# Patient Record
Sex: Female | Born: 1965 | Race: White | Hispanic: No | Marital: Married | Smoking: Never smoker
Health system: Southern US, Community
[De-identification: ages and names within clinical notes are randomized; demographics above are authoritative.]

## PROBLEM LIST (undated history)

## (undated) DIAGNOSIS — E079 Disorder of thyroid, unspecified: Secondary | ICD-10-CM

## (undated) DIAGNOSIS — I10 Essential (primary) hypertension: Secondary | ICD-10-CM

## (undated) DIAGNOSIS — C801 Malignant (primary) neoplasm, unspecified: Secondary | ICD-10-CM

## (undated) DIAGNOSIS — E119 Type 2 diabetes mellitus without complications: Secondary | ICD-10-CM

## (undated) HISTORY — PX: OTHER SURGICAL HISTORY: SHX169

## (undated) HISTORY — PX: CHOLECYSTECTOMY: SHX55

---

## 2010-02-02 ENCOUNTER — Ambulatory Visit: Payer: Self-pay | Admitting: Internal Medicine

## 2015-02-16 ENCOUNTER — Ambulatory Visit
Admission: EM | Admit: 2015-02-16 | Discharge: 2015-02-16 | Disposition: A | Discharge status: Home / Self Care | Payer: BC Managed Care – PPO | Attending: Family Medicine | Admitting: Family Medicine

## 2015-02-16 ENCOUNTER — Ambulatory Visit (INDEPENDENT_AMBULATORY_CARE_PROVIDER_SITE_OTHER): Payer: BC Managed Care – PPO

## 2015-02-16 DIAGNOSIS — N200 Calculus of kidney: Secondary | ICD-10-CM | POA: Diagnosis not present

## 2015-02-16 DIAGNOSIS — R319 Hematuria, unspecified: Secondary | ICD-10-CM

## 2015-02-16 DIAGNOSIS — R1110 Vomiting, unspecified: Secondary | ICD-10-CM

## 2015-02-16 DIAGNOSIS — R1012 Left upper quadrant pain: Secondary | ICD-10-CM | POA: Diagnosis not present

## 2015-02-16 DIAGNOSIS — R112 Nausea with vomiting, unspecified: Secondary | ICD-10-CM | POA: Diagnosis not present

## 2015-02-16 DIAGNOSIS — R109 Unspecified abdominal pain: Secondary | ICD-10-CM

## 2015-02-16 HISTORY — DX: Malignant (primary) neoplasm, unspecified: C80.1

## 2015-02-16 HISTORY — DX: Disorder of thyroid, unspecified: E07.9

## 2015-02-16 HISTORY — DX: Type 2 diabetes mellitus without complications: E11.9

## 2015-02-16 HISTORY — DX: Essential (primary) hypertension: I10

## 2015-02-16 LAB — URINALYSIS COMPLETE WITH MICROSCOPIC (ARMC ONLY)
BACTERIA UA: NONE SEEN
Bilirubin Urine: NEGATIVE
Glucose, UA: NEGATIVE mg/dL
Ketones, ur: NEGATIVE mg/dL
Leukocytes, UA: NEGATIVE
NITRITE: NEGATIVE
PROTEIN: NEGATIVE mg/dL
SPECIFIC GRAVITY, URINE: 1.02 (ref 1.005–1.030)
pH: 6 (ref 5.0–8.0)

## 2015-02-16 LAB — BASIC METABOLIC PANEL
Anion gap: 10 (ref 5–15)
BUN: 25 mg/dL — AB (ref 6–20)
CO2: 24 mmol/L (ref 22–32)
Calcium: 9.5 mg/dL (ref 8.9–10.3)
Chloride: 103 mmol/L (ref 101–111)
Creatinine, Ser: 1.27 mg/dL — ABNORMAL HIGH (ref 0.44–1.00)
GFR, EST AFRICAN AMERICAN: 56 mL/min — AB (ref >60)
GFR, EST NON AFRICAN AMERICAN: 48 mL/min — AB (ref >60)
Glucose, Bld: 137 mg/dL — ABNORMAL HIGH (ref 65–99)
POTASSIUM: 4.5 mmol/L (ref 3.5–5.1)
SODIUM: 137 mmol/L (ref 135–145)

## 2015-02-16 LAB — CBC WITH DIFFERENTIAL/PLATELET
BASOS ABS: 0.1 10*3/uL (ref 0–0.1)
Basophils Relative: 1 %
EOS ABS: 0.1 10*3/uL (ref 0–0.7)
EOS PCT: 1 %
HCT: 38.5 % (ref 35.0–47.0)
Hemoglobin: 12.6 g/dL (ref 12.0–16.0)
LYMPHS PCT: 19 %
Lymphs Abs: 2.1 10*3/uL (ref 1.0–3.6)
MCH: 29 pg (ref 26.0–34.0)
MCHC: 32.8 g/dL (ref 32.0–36.0)
MCV: 88.3 fL (ref 80.0–100.0)
Monocytes Absolute: 1.1 10*3/uL — ABNORMAL HIGH (ref 0.2–0.9)
Monocytes Relative: 9 %
Neutro Abs: 7.9 10*3/uL — ABNORMAL HIGH (ref 1.4–6.5)
Neutrophils Relative %: 70 %
PLATELETS: 224 10*3/uL (ref 150–440)
RBC: 4.36 MIL/uL (ref 3.80–5.20)
RDW: 12.8 % (ref 11.5–14.5)
WBC: 11.3 10*3/uL — AB (ref 3.6–11.0)

## 2015-02-16 MED ORDER — HYDROCODONE-ACETAMINOPHEN 5-325 MG PO TABS: 1.0000 | ORAL_TABLET | Freq: Four times a day (QID) | ORAL | Status: AC | PRN

## 2015-02-16 MED ORDER — ONDANSETRON 8 MG PO TBDP: 8.0000 mg | ORAL_TABLET | Freq: Two times a day (BID) | ORAL | Status: AC

## 2015-02-16 MED ORDER — ONDANSETRON 8 MG PO TBDP
8.0000 mg | ORAL_TABLET | Freq: Once | ORAL | Status: AC
  Administered 2015-02-16: 8 mg via ORAL

## 2015-02-16 MED ORDER — KETOROLAC TROMETHAMINE 60 MG/2ML IM SOLN: 60.0000 mg | Freq: Once | INTRAMUSCULAR | Status: DC

## 2015-02-16 MED ORDER — TAMSULOSIN HCL 0.4 MG PO CAPS: 0.4000 mg | ORAL_CAPSULE | Freq: Every day | ORAL | Status: AC

## 2015-02-16 NOTE — ED Notes (Signed)
Woke suddenly at 2am with left flank pain radiating through to LUQ abdomen. Vomited when attempted to drink anything. Still nauseated. Hx kidney stone  05/2014.

## 2015-04-11 NOTE — ED Provider Notes (Signed)
CSN: [id number]     Arrival date & time 02/16/15  1056 History   First MD Initiated Contact with Patient 02/16/15 1145     Chief Complaint  Patient presents with  . Nephrolithiasis   (Consider location/radiation/quality/duration/timing/severity/associated sxs/prior Treatment) HPI Comments: 50 yo female with a c/o left flank pain since 2am this morning, associated with vomiting x2 today. Currently nauseated. Denies any fevers, chills. Has a h/o kidney stone.   The history is provided by the patient.    Past Medical History  Diagnosis Date  . Diabetes mellitus without complication ([place])   . Hypertension   . Thyroid disease   . Cancer [facility])    Past Surgical History  Procedure Laterality Date  . Pancreas removal    . Cholecystectomy     Family History  Problem Relation Age of Onset  . Cancer Father    Social History  Substance Use Topics  . Smoking status: Never Smoker   . Smokeless tobacco: None  . Alcohol Use: No   OB History    No data available     Review of Systems  Allergies  Penicillins and Erythromycin  Home Medications   Prior to Admission medications   Medication Sig Start Date End Date Taking? Authorizing Provider  cetirizine (ZYRTEC) 10 MG tablet Take 10 mg by mouth daily.   Yes Historical Provider, MD  levothyroxine (SYNTHROID, LEVOTHROID) 50 MCG tablet Take 50 mcg by mouth daily before breakfast.   Yes Historical Provider, MD  metFORMIN (GLUCOPHAGE) 500 MG tablet Take by mouth 2 (two) times daily with a meal.   Yes Historical Provider, MD  HYDROcodone-acetaminophen (NORCO/VICODIN) 5-325 MG tablet Take 1-2 tablets by mouth every 6 (six) hours as needed. 02/16/15   Norval Gable, MD  ondansetron (ZOFRAN ODT) 8 MG disintegrating tablet Take 1 tablet (8 mg total) by mouth 2 (two) times daily. 02/16/15   Norval Gable, MD  tamsulosin (FLOMAX) 0.4 MG CAPS capsule Take 1 capsule (0.4 mg total) by mouth daily. 02/16/15   Norval Gable, MD   Meds Ordered and  Administered this Visit   Medications  ondansetron (ZOFRAN-ODT) disintegrating tablet 8 mg (8 mg Oral Given 02/16/15 1118)    BP 144/72 mmHg  Pulse 68  Temp(Src) 97.3 F (36.3 C) (Tympanic)  Resp 16  Ht 5\' 3"  (1.6 m)  Wt 195 lb (88.451 kg)  BMI 34.55 kg/m2  SpO2 99% No data found.   Physical Exam  Constitutional: She appears well-developed and well-nourished. No distress.  Abdominal: Soft. Bowel sounds are normal. She exhibits no distension and no mass. There is tenderness (left flank). There is no rebound and no guarding.  Skin: She is not diaphoretic.  Nursing note and vitals reviewed.   ED Course  Procedures (including critical care time)  Labs Review Labs Reviewed  URINALYSIS COMPLETEWITH MICROSCOPIC ([facility] ONLY) - Abnormal; Notable for the following:    Color, Urine STRAW (*)    Hgb urine dipstick TRACE (*)    Squamous Epithelial / LPF 0-5 (*)    All other components within normal limits  BASIC METABOLIC PANEL - Abnormal; Notable for the following:    Glucose, Bld 137 (*)    BUN 25 (*)    Creatinine, Ser 1.27 (*)    GFR calc non Af Amer 48 (*)    GFR calc Af Amer 56 (*)    All other components within normal limits  CBC WITH DIFFERENTIAL/PLATELET - Abnormal; Notable for the following:    WBC 11.3 (*)  Neutro Abs 7.9 (*)    Monocytes Absolute 1.1 (*)    All other components within normal limits    Imaging Review No results found.   Visual Acuity Review  Right Eye Distance:   Left Eye Distance:   Bilateral Distance:    Right Eye Near:   Left Eye Near:    Bilateral Near:         MDM   1. Nephrolithiasis   2. Vomiting   3. Acute left flank pain   4. Hematuria   5. Nausea and vomiting, vomiting of unspecified type    Discharge Medication List as of 02/16/2015  2:28 PM    START taking these medications   Details  HYDROcodone-acetaminophen (NORCO/VICODIN) 5-325 MG tablet Take 1-2 tablets by mouth every 6 (six) hours as needed., Starting  02/16/2015, Until Discontinued, Print    ondansetron (ZOFRAN ODT) 8 MG disintegrating tablet Take 1 tablet (8 mg total) by mouth 2 (two) times daily., Starting 02/16/2015, Until Discontinued, Normal    tamsulosin (FLOMAX) 0.4 MG CAPS capsule Take 1 capsule (0.4 mg total) by mouth daily., Starting 02/16/2015, Until Discontinued, Normal       1. Labs/x-ray results and diagnosis reviewed with patient 2. rx as per orders above; reviewed possible side effects, interactions, risks and benefits  3. Recommend supportive treatment with increased fluids 4. Patient given zofran 8mg  odt with improvement of symptoms; tolerating po fluids prior to d/c 4. Follow-up prn if symptoms worsen or don't improve; to ED if symptoms worsen    Norval Gable, MD 04/11/15 1819

## 2017-01-15 IMAGING — CT CT RENAL STONE PROTOCOL
2 of 4 series · 16 of 46 positions shown, 18 images · non-contrast
Comparison: None.

CLINICAL DATA: Lower abdominal pain, primarily left-sided

EXAM:
CT ABDOMEN AND PELVIS WITHOUT CONTRAST
TECHNIQUE: Multidetector CT imaging of the abdomen and pelvis was performed
following the standard protocol without oral or intravenous contrast
material administration.

[Series 2: soft tissue · axial · 0.71mm/px · z∈[-904,-459]mm · 13 of 99 slices shown, 15 images]
[im 5/99  soft-tissue]
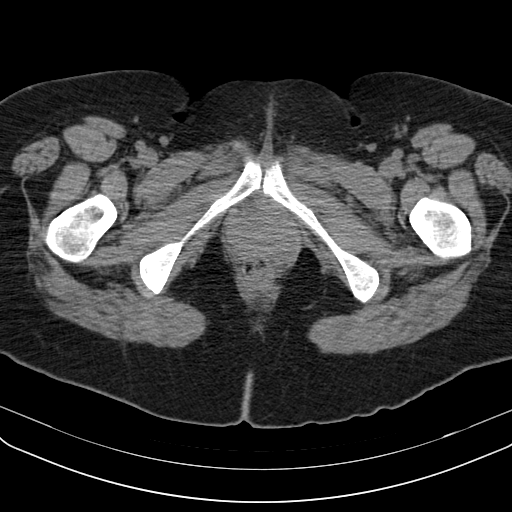
[im 5/99  bone]
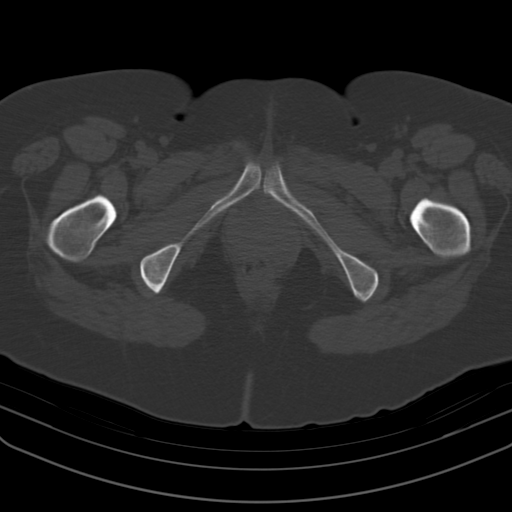
[im 13/99  soft-tissue]
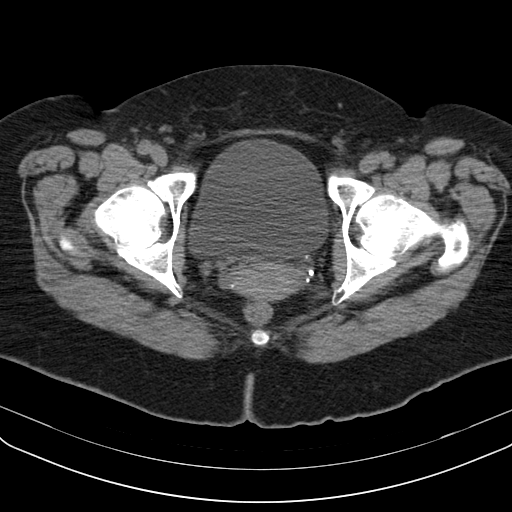
[im 22/99  soft-tissue]
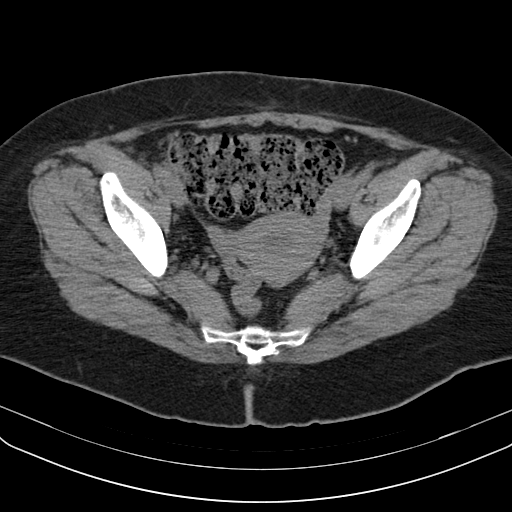
[im 26/99  soft-tissue]
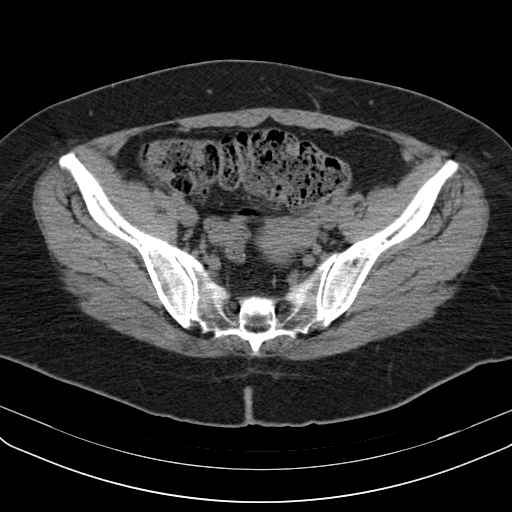
[im 35/99  soft-tissue]
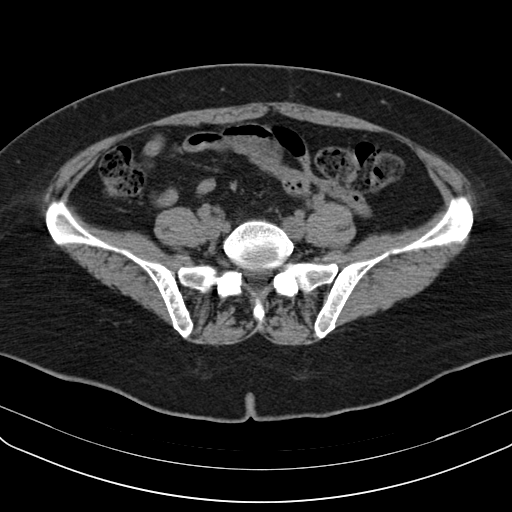
[im 43/99  soft-tissue]
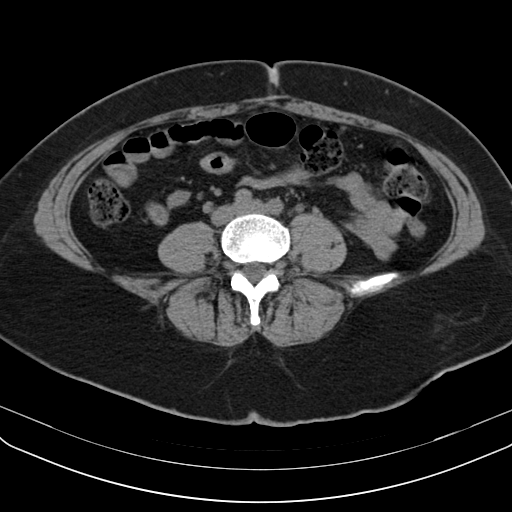
[im 52/99  soft-tissue]
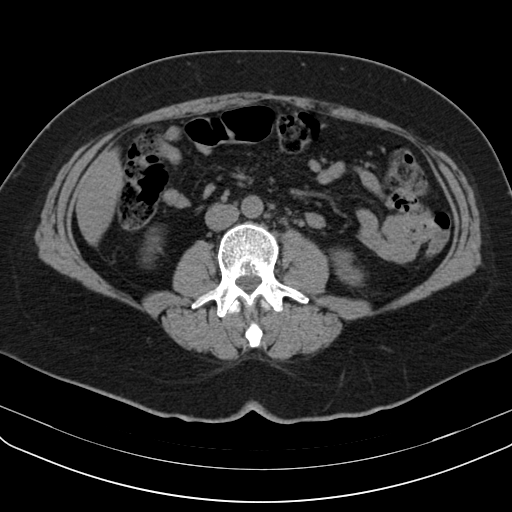
[im 56/99  soft-tissue]
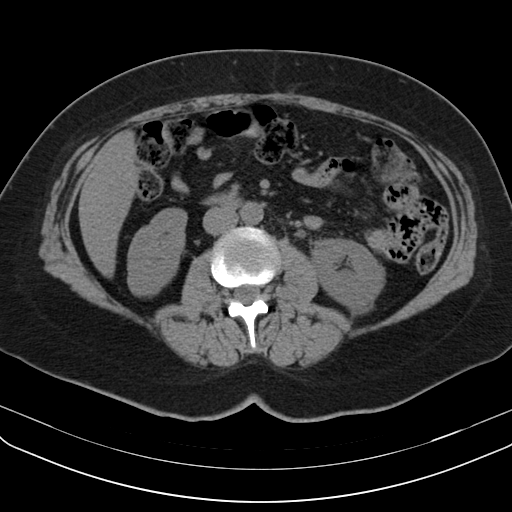
[im 64/99  soft-tissue]
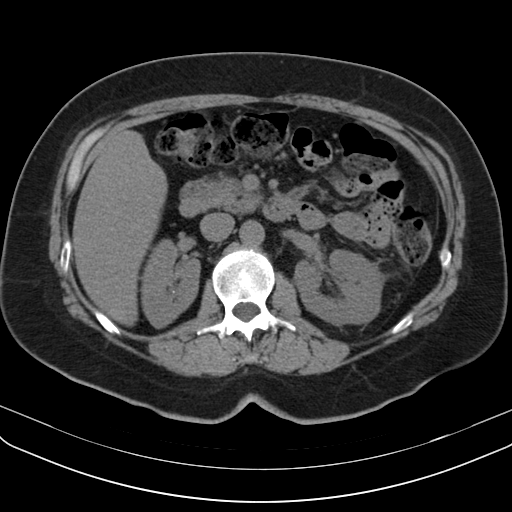
[im 64/99  bone]
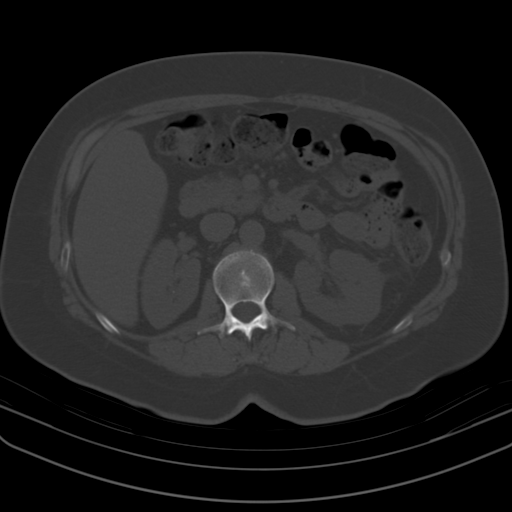
[im 73/99  soft-tissue]
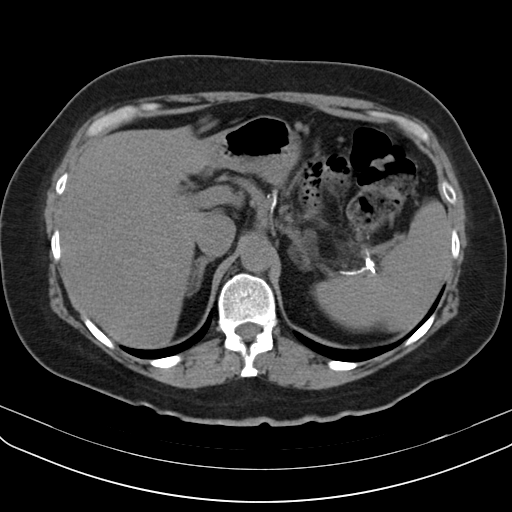
[im 77/99  soft-tissue]
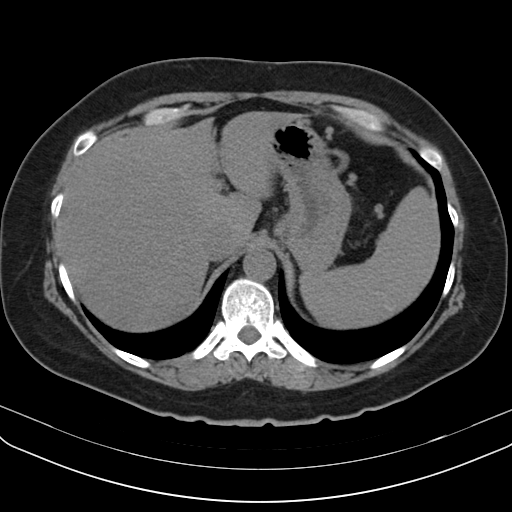
[im 86/99  soft-tissue]
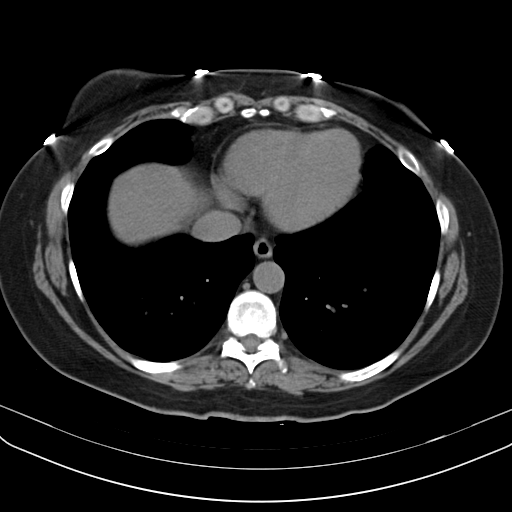
[im 94/99  soft-tissue]
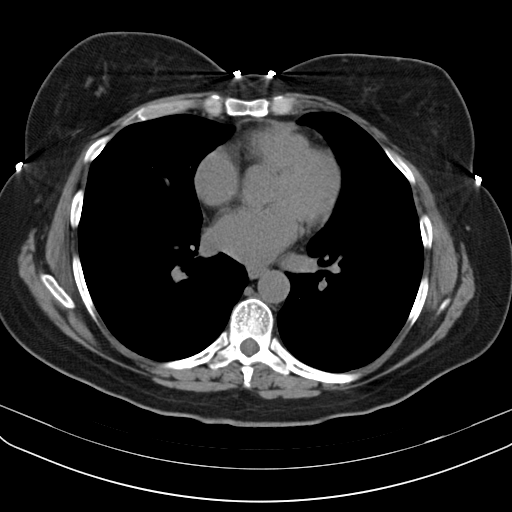

[Series 602: coronal · coronal · 0.97mm/px · 3 of 105 slices shown]
[im 35/105  soft-tissue]
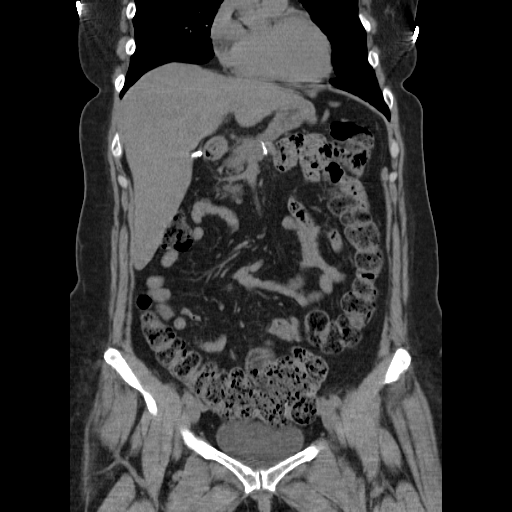
[im 47/105  soft-tissue]
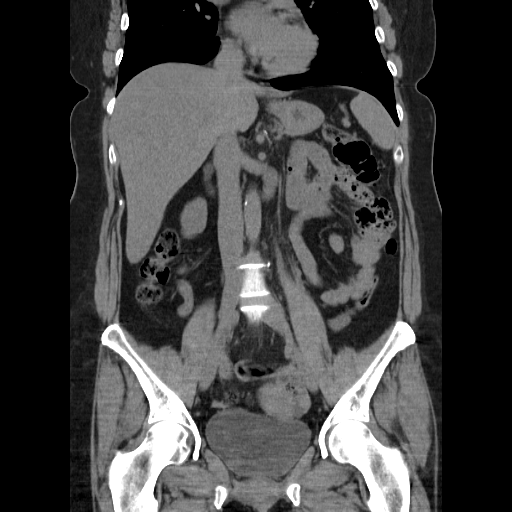
[im 58/105  soft-tissue]
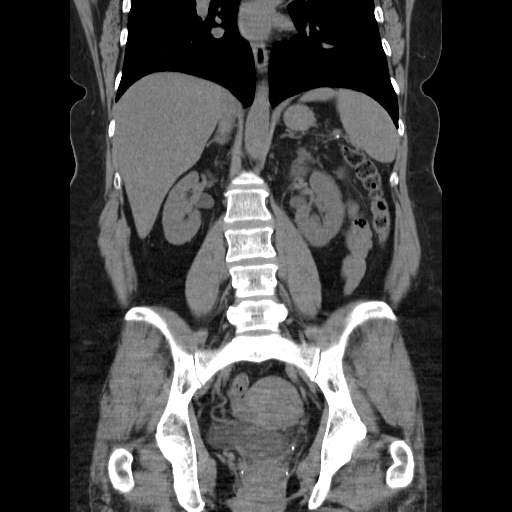

[16 of 46 positions shown; findings below may reference images not displayed]

FINDINGS: Lower chest:  Lung bases are clear.

Hepatobiliary: Liver is prominent, measuring 20.6 cm in length.
There is a Riedel's lobe on the right, an anatomic variant. No focal
liver lesions are identified on this noncontrast enhanced study.
Gallbladder is absent. There is no appreciable biliary duct
dilatation.

Pancreas: There has been surgical removal of the body and tail of
the pancreas with scarring in the peripancreatic region, likely
postoperative. Remaining pancreas appears normal without mass or
inflammatory focus.

Spleen: No splenic lesions are identified.

Adrenals/Urinary Tract: Right adrenal appears normal. There is a
x 1.0 cm left adrenal adenoma. There is no renal mass on either
side. There is no hydronephrosis on the right. There is mild
hydronephrosis on the left. There is moderate perinephric edema on
the left. There is a 1 mm calculus in the midportion right kidney.
There is no ureteral calculus on the right. On the left, there is no
intrarenal calculus. There is a 2 mm calculus at the left
ureterovesical junction. No other ureteral calculi are identified.
The urinary bladder is midline with wall thickness within normal
limits.

Stomach/Bowel: There is diffuse stool throughout the colon. There is
no bowel wall or mesenteric thickening. There is no appreciable
bowel obstruction. No free air or portal venous air.

Vascular/Lymphatic: There is mild calcification in the aorta. There
is no abdominal aortic aneurysm. No vascular lesions are appreciable
on this noncontrast enhanced study. There is no appreciable
adenopathy in the abdomen or pelvis.

Reproductive: Uterus is anteverted. There is no pelvic mass or
pelvic fluid collection.

Other: Appendix appears normal. The proximal aspect of the appendix
is to the left of midline. There is no abscess or ascites in the
abdomen or pelvis.

Musculoskeletal: There are no blastic or lytic bone lesions. There
is no intramuscular or abdominal wall lesion.
IMPRESSION: 2 mm calculus left ureterovesical junction with mild hydronephrosis
on the left and moderate left perinephric stranding/edema.

1 mm nonobstructing calculus mid right kidney.

Status post surgical removal of the body and tail the pancreas.
Scarring in the mid abdomen in the peripancreatic region is noted.
Remaining pancreas appears normal.

Gallbladder absent.  Prominent liver without focal lesion.

Appendix appears normal. Note that the proximal aspect of the
appendix is to the left of midline. No bowel obstruction or abscess.
